# Patient Record
Sex: Female | Born: 1955 | Race: White | Hispanic: No | Marital: Married
Health system: Southern US, Community
[De-identification: ages and names within clinical notes are randomized; demographics above are authoritative.]

## PROBLEM LIST (undated history)

## (undated) DIAGNOSIS — C801 Malignant (primary) neoplasm, unspecified: Secondary | ICD-10-CM

## (undated) DIAGNOSIS — Z923 Personal history of irradiation: Secondary | ICD-10-CM

---
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
## 2005-05-17 ENCOUNTER — Ambulatory Visit: Payer: Self-pay | Admitting: Internal Medicine

## 2005-11-12 ENCOUNTER — Ambulatory Visit: Payer: Self-pay | Admitting: Internal Medicine

## 2006-06-12 ENCOUNTER — Ambulatory Visit: Payer: Self-pay | Admitting: Internal Medicine

## 2007-06-16 ENCOUNTER — Ambulatory Visit: Payer: Self-pay | Admitting: Internal Medicine

## 2007-06-19 ENCOUNTER — Ambulatory Visit: Payer: Self-pay | Admitting: Internal Medicine

## 2008-06-21 ENCOUNTER — Ambulatory Visit: Payer: Self-pay | Admitting: Internal Medicine

## 2009-07-14 ENCOUNTER — Ambulatory Visit: Payer: Self-pay | Admitting: Internal Medicine

## 2010-07-17 ENCOUNTER — Ambulatory Visit: Payer: Self-pay | Admitting: Internal Medicine

## 2011-07-23 ENCOUNTER — Ambulatory Visit: Payer: Self-pay | Admitting: Internal Medicine

## 2012-07-28 ENCOUNTER — Ambulatory Visit: Payer: Self-pay | Admitting: Internal Medicine

## 2013-04-22 ENCOUNTER — Ambulatory Visit: Payer: Self-pay | Admitting: Radiation Oncology

## 2013-04-23 ENCOUNTER — Ambulatory Visit: Payer: Self-pay | Admitting: Radiation Oncology

## 2013-05-12 LAB — CBC CANCER CENTER
Basophil #: 0 x10 3/mm (ref 0.0–0.1)
Eosinophil #: 0.1 x10 3/mm (ref 0.0–0.7)
HCT: 38.6 % (ref 35.0–47.0)
Lymphocyte #: 1.6 x10 3/mm (ref 1.0–3.6)
Lymphocyte %: 17.2 %
MCHC: 34.7 g/dL (ref 32.0–36.0)
MCV: 90 fL (ref 80–100)
Monocyte #: 0.5 x10 3/mm (ref 0.2–0.9)
Neutrophil #: 7.2 x10 3/mm — ABNORMAL HIGH (ref 1.4–6.5)
Neutrophil %: 75.7 %
Platelet: 233 x10 3/mm (ref 150–440)
RDW: 13.3 % (ref 11.5–14.5)
WBC: 9.5 x10 3/mm (ref 3.6–11.0)

## 2013-05-19 LAB — CBC CANCER CENTER
Basophil #: 0.1 x10 3/mm (ref 0.0–0.1)
Eosinophil #: 0.1 x10 3/mm (ref 0.0–0.7)
Eosinophil %: 1.6 %
Lymphocyte %: 28.7 %
MCH: 31.6 pg (ref 26.0–34.0)
MCV: 90 fL (ref 80–100)
Monocyte %: 5.9 %
Neutrophil #: 4.4 x10 3/mm (ref 1.4–6.5)
Platelet: 227 x10 3/mm (ref 150–440)

## 2013-05-24 ENCOUNTER — Ambulatory Visit: Payer: Self-pay | Admitting: Radiation Oncology

## 2013-06-23 ENCOUNTER — Ambulatory Visit: Payer: Self-pay | Admitting: Radiation Oncology

## 2013-07-30 ENCOUNTER — Ambulatory Visit: Payer: Self-pay | Admitting: Internal Medicine

## 2013-09-03 ENCOUNTER — Ambulatory Visit: Payer: Self-pay | Admitting: Gastroenterology

## 2013-11-26 ENCOUNTER — Ambulatory Visit: Payer: Self-pay | Admitting: Radiation Oncology

## 2013-12-22 ENCOUNTER — Ambulatory Visit: Payer: Self-pay | Admitting: Radiation Oncology

## 2014-06-08 ENCOUNTER — Ambulatory Visit: Payer: Self-pay | Admitting: Radiation Oncology

## 2014-06-23 ENCOUNTER — Ambulatory Visit: Payer: Self-pay | Admitting: Radiation Oncology

## 2014-08-02 ENCOUNTER — Ambulatory Visit: Payer: Self-pay | Admitting: Internal Medicine

## 2014-08-08 ENCOUNTER — Ambulatory Visit: Payer: Self-pay | Admitting: Internal Medicine
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
## 2015-06-09 ENCOUNTER — Ambulatory Visit: Payer: Self-pay | Admitting: Radiation Oncology

## 2015-06-15 ENCOUNTER — Other Ambulatory Visit: Payer: Self-pay | Admitting: Internal Medicine

## 2015-06-15 DIAGNOSIS — Z1231 Encounter for screening mammogram for malignant neoplasm of breast: Secondary | ICD-10-CM

## 2015-08-11 ENCOUNTER — Ambulatory Visit
Admission: RE | Admit: 2015-08-11 | Discharge: 2015-08-11 | Disposition: A | Discharge status: Home / Self Care | Payer: Federal, State, Local not specified - PPO | Source: Ambulatory Visit | Attending: Internal Medicine | Admitting: Internal Medicine

## 2015-08-11 DIAGNOSIS — Z1231 Encounter for screening mammogram for malignant neoplasm of breast: Secondary | ICD-10-CM

## 2015-08-11 HISTORY — DX: Malignant (primary) neoplasm, unspecified: C80.1

## 2016-06-18 ENCOUNTER — Other Ambulatory Visit: Payer: Self-pay | Admitting: Internal Medicine

## 2016-06-18 DIAGNOSIS — Z1231 Encounter for screening mammogram for malignant neoplasm of breast: Secondary | ICD-10-CM

## 2016-08-13 ENCOUNTER — Ambulatory Visit
Admission: RE | Admit: 2016-08-13 | Discharge: 2016-08-13 | Disposition: A | Discharge status: Home / Self Care | Payer: Federal, State, Local not specified - PPO | Source: Ambulatory Visit | Attending: Internal Medicine | Admitting: Internal Medicine

## 2016-08-13 DIAGNOSIS — Z1231 Encounter for screening mammogram for malignant neoplasm of breast: Secondary | ICD-10-CM | POA: Insufficient documentation

## 2016-08-14 ENCOUNTER — Other Ambulatory Visit: Payer: Self-pay | Admitting: Internal Medicine

## 2016-08-14 DIAGNOSIS — N6489 Other specified disorders of breast: Secondary | ICD-10-CM

## 2016-08-27 ENCOUNTER — Ambulatory Visit
Admission: RE | Admit: 2016-08-27 | Discharge: 2016-08-27 | Disposition: A | Discharge status: Home / Self Care | Payer: Federal, State, Local not specified - PPO | Source: Ambulatory Visit | Attending: Internal Medicine | Admitting: Internal Medicine

## 2016-08-27 DIAGNOSIS — N6489 Other specified disorders of breast: Secondary | ICD-10-CM | POA: Diagnosis present

## 2016-09-03 ENCOUNTER — Other Ambulatory Visit: Payer: Federal, State, Local not specified - PPO

## 2016-09-03 ENCOUNTER — Ambulatory Visit: Payer: Federal, State, Local not specified - PPO

## 2017-06-24 ENCOUNTER — Other Ambulatory Visit: Payer: Self-pay | Admitting: Internal Medicine

## 2017-06-24 DIAGNOSIS — Z1231 Encounter for screening mammogram for malignant neoplasm of breast: Secondary | ICD-10-CM

## 2017-08-18 ENCOUNTER — Ambulatory Visit
Admission: RE | Admit: 2017-08-18 | Discharge: 2017-08-18 | Disposition: A | Discharge status: Home / Self Care | Payer: Federal, State, Local not specified - PPO | Source: Ambulatory Visit | Attending: Internal Medicine | Admitting: Internal Medicine

## 2017-08-18 DIAGNOSIS — Z1231 Encounter for screening mammogram for malignant neoplasm of breast: Secondary | ICD-10-CM | POA: Insufficient documentation

## 2017-08-20 ENCOUNTER — Other Ambulatory Visit: Payer: Self-pay | Admitting: Internal Medicine

## 2017-08-20 DIAGNOSIS — R921 Mammographic calcification found on diagnostic imaging of breast: Secondary | ICD-10-CM

## 2017-08-20 DIAGNOSIS — N6489 Other specified disorders of breast: Secondary | ICD-10-CM

## 2017-08-20 DIAGNOSIS — R928 Other abnormal and inconclusive findings on diagnostic imaging of breast: Secondary | ICD-10-CM

## 2017-08-27 ENCOUNTER — Ambulatory Visit
Admission: RE | Admit: 2017-08-27 | Discharge: 2017-08-27 | Disposition: A | Discharge status: Home / Self Care | Payer: Federal, State, Local not specified - PPO | Source: Ambulatory Visit | Attending: Internal Medicine | Admitting: Internal Medicine

## 2017-08-27 DIAGNOSIS — R928 Other abnormal and inconclusive findings on diagnostic imaging of breast: Secondary | ICD-10-CM

## 2017-08-27 DIAGNOSIS — R921 Mammographic calcification found on diagnostic imaging of breast: Secondary | ICD-10-CM

## 2017-08-27 DIAGNOSIS — N6321 Unspecified lump in the left breast, upper outer quadrant: Secondary | ICD-10-CM | POA: Insufficient documentation

## 2017-08-27 DIAGNOSIS — N6489 Other specified disorders of breast: Secondary | ICD-10-CM | POA: Insufficient documentation

## 2017-08-27 DIAGNOSIS — N6001 Solitary cyst of right breast: Secondary | ICD-10-CM | POA: Diagnosis not present

## 2017-08-28 ENCOUNTER — Ambulatory Visit: Payer: Federal, State, Local not specified - PPO

## 2017-08-28 ENCOUNTER — Other Ambulatory Visit: Payer: Federal, State, Local not specified - PPO

## 2017-09-03 ENCOUNTER — Other Ambulatory Visit: Payer: Self-pay | Admitting: Internal Medicine

## 2017-09-03 DIAGNOSIS — N6310 Unspecified lump in the right breast, unspecified quadrant: Secondary | ICD-10-CM

## 2017-09-03 DIAGNOSIS — R928 Other abnormal and inconclusive findings on diagnostic imaging of breast: Secondary | ICD-10-CM

## 2017-09-08 ENCOUNTER — Ambulatory Visit
Admission: RE | Admit: 2017-09-08 | Discharge: 2017-09-08 | Disposition: A | Discharge status: Home / Self Care | Payer: Federal, State, Local not specified - PPO | Source: Ambulatory Visit | Attending: Internal Medicine | Admitting: Internal Medicine

## 2017-09-08 DIAGNOSIS — N6311 Unspecified lump in the right breast, upper outer quadrant: Secondary | ICD-10-CM | POA: Insufficient documentation

## 2017-09-08 DIAGNOSIS — R928 Other abnormal and inconclusive findings on diagnostic imaging of breast: Secondary | ICD-10-CM

## 2017-09-08 DIAGNOSIS — N6310 Unspecified lump in the right breast, unspecified quadrant: Secondary | ICD-10-CM

## 2017-09-08 HISTORY — PX: BREAST BIOPSY: SHX20

## 2017-09-09 LAB — SURGICAL PATHOLOGY

## 2017-11-06 ENCOUNTER — Other Ambulatory Visit: Payer: Self-pay | Admitting: Internal Medicine

## 2017-11-06 DIAGNOSIS — N6320 Unspecified lump in the left breast, unspecified quadrant: Principal | ICD-10-CM

## 2017-11-06 DIAGNOSIS — N6325 Unspecified lump in the left breast, overlapping quadrants: Secondary | ICD-10-CM

## 2017-11-13 ENCOUNTER — Ambulatory Visit
Admission: RE | Admit: 2017-11-13 | Discharge: 2017-11-13 | Disposition: A | Discharge status: Home / Self Care | Payer: Federal, State, Local not specified - PPO | Source: Ambulatory Visit | Attending: Internal Medicine | Admitting: Internal Medicine

## 2017-11-13 DIAGNOSIS — N6320 Unspecified lump in the left breast, unspecified quadrant: Secondary | ICD-10-CM | POA: Diagnosis present

## 2017-11-13 DIAGNOSIS — N6002 Solitary cyst of left breast: Secondary | ICD-10-CM | POA: Insufficient documentation

## 2017-11-13 DIAGNOSIS — N6325 Unspecified lump in the left breast, overlapping quadrants: Secondary | ICD-10-CM

## 2018-11-10 ENCOUNTER — Other Ambulatory Visit: Payer: Self-pay | Admitting: Internal Medicine

## 2018-11-10 DIAGNOSIS — R921 Mammographic calcification found on diagnostic imaging of breast: Secondary | ICD-10-CM

## 2018-11-10 DIAGNOSIS — N6001 Solitary cyst of right breast: Secondary | ICD-10-CM

## 2018-11-20 ENCOUNTER — Ambulatory Visit
Admission: RE | Admit: 2018-11-20 | Discharge: 2018-11-20 | Disposition: A | Discharge status: Home / Self Care | Payer: Federal, State, Local not specified - PPO | Source: Ambulatory Visit | Attending: Internal Medicine | Admitting: Internal Medicine

## 2018-11-20 DIAGNOSIS — R921 Mammographic calcification found on diagnostic imaging of breast: Secondary | ICD-10-CM | POA: Diagnosis present

## 2018-11-20 DIAGNOSIS — N6001 Solitary cyst of right breast: Secondary | ICD-10-CM | POA: Insufficient documentation

## 2019-02-02 ENCOUNTER — Ambulatory Visit: Admit: 2019-02-02 | Payer: Federal, State, Local not specified - PPO | Admitting: Internal Medicine

## 2019-02-02 SURGERY — COLONOSCOPY WITH PROPOFOL
Anesthesia: General

## 2019-03-05 IMAGING — MG MM DIGITAL SCREENING BILAT W/ TOMO W/ CAD
9 of 19 series · 9 of 35 positions shown · non-contrast
Comparison: Previous exam(s).

CLINICAL DATA: Screening.

EXAM:
2D DIGITAL SCREENING BILATERAL MAMMOGRAM WITH CAD AND ADJUNCT TOMO

[R MLO (1 of 2)]
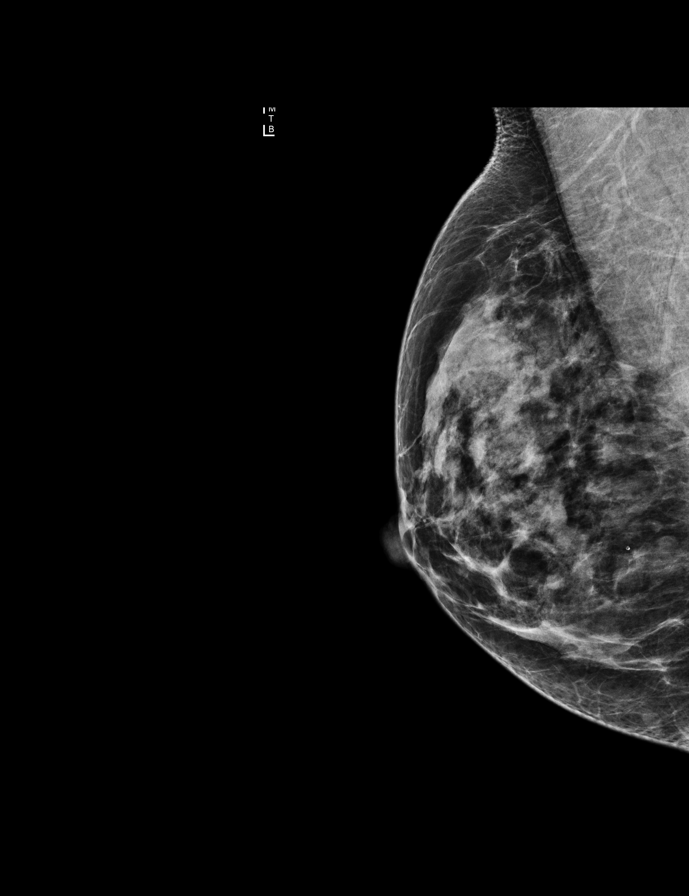

[L MLO synth-2D]
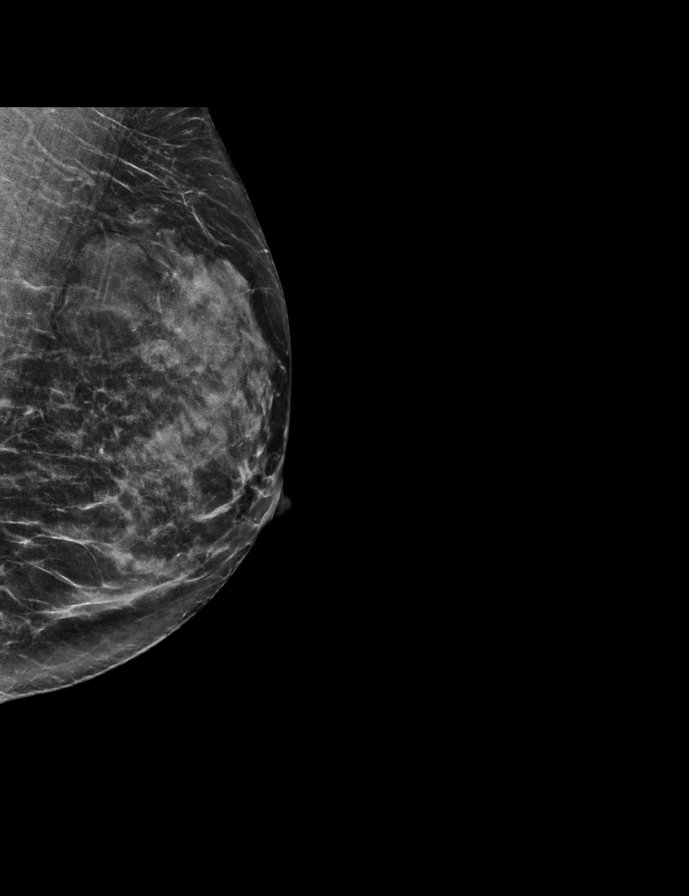

[L MLO]
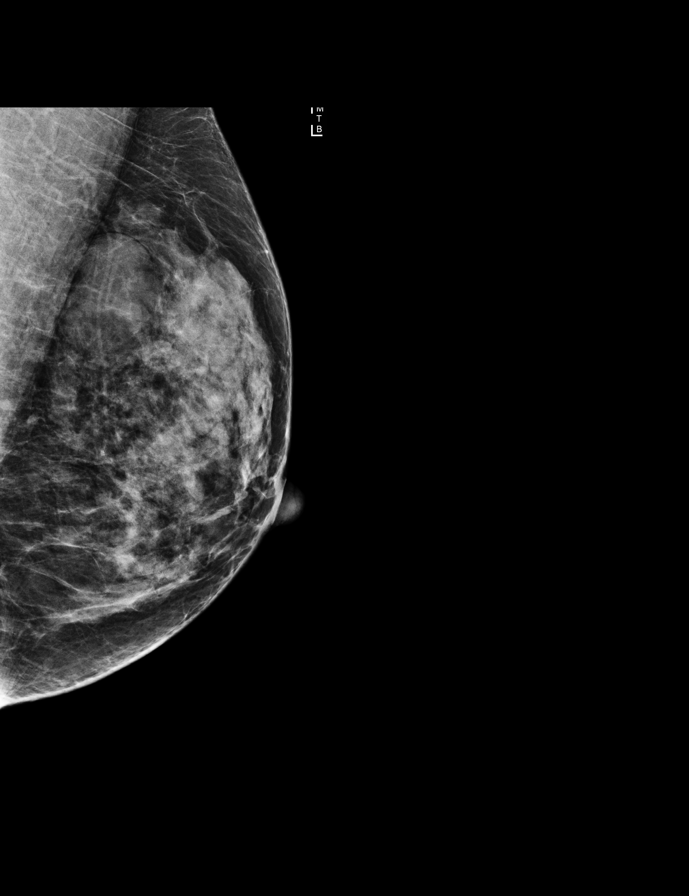

[R MLO (2 of 2)]
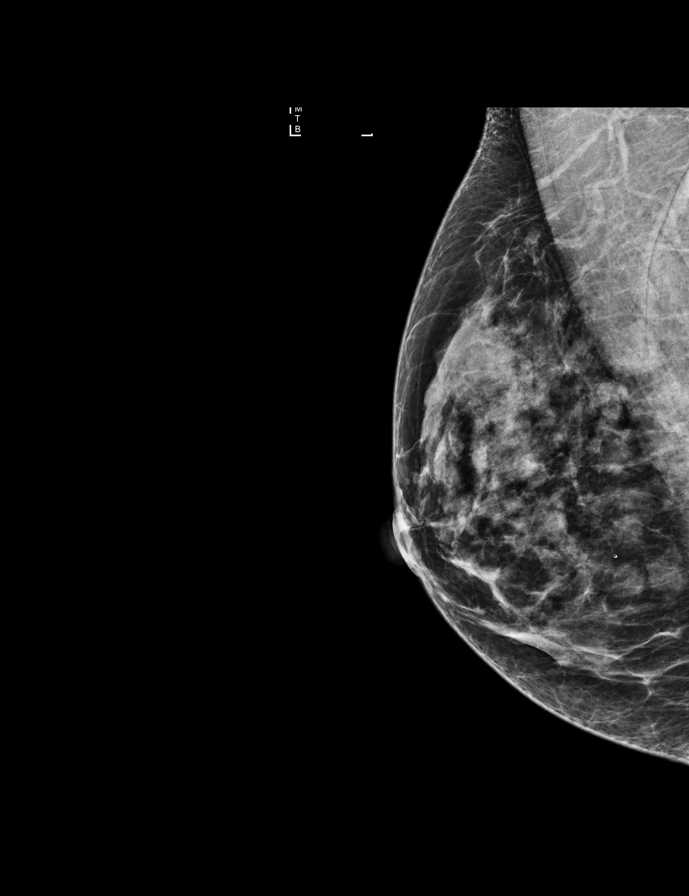

[R CC]
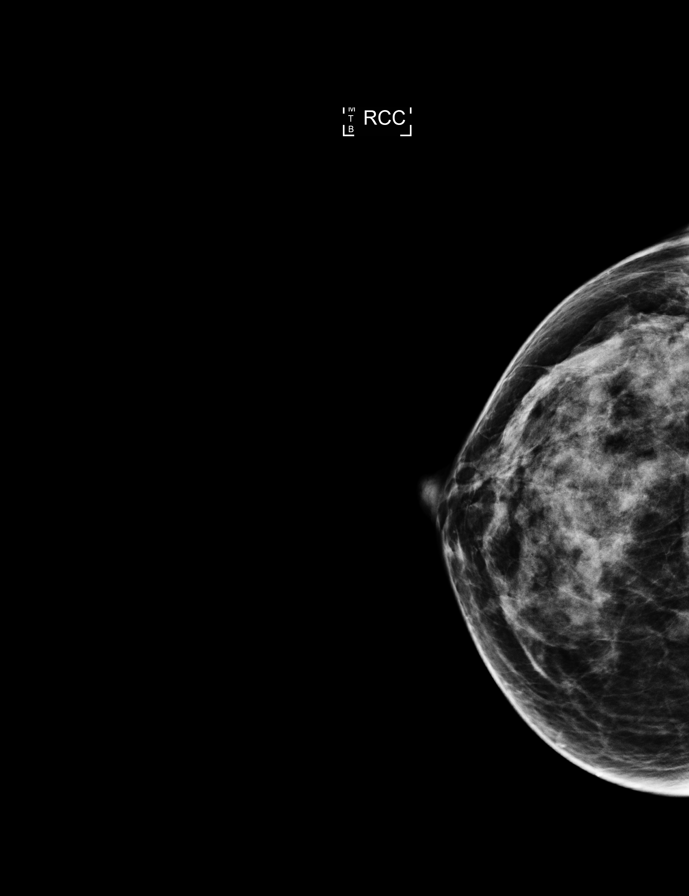

[R CC synth-2D]
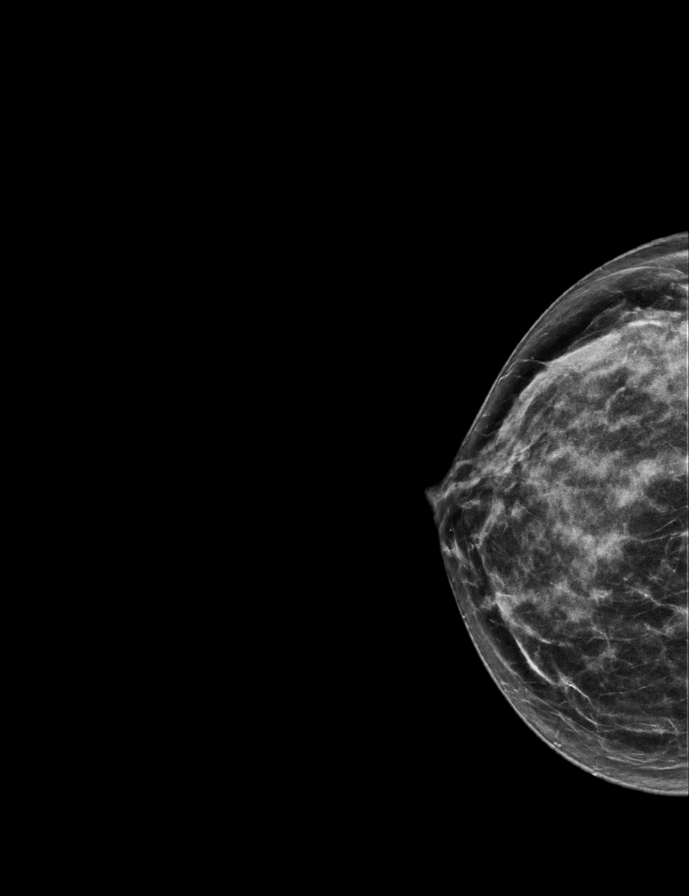

[L CC]
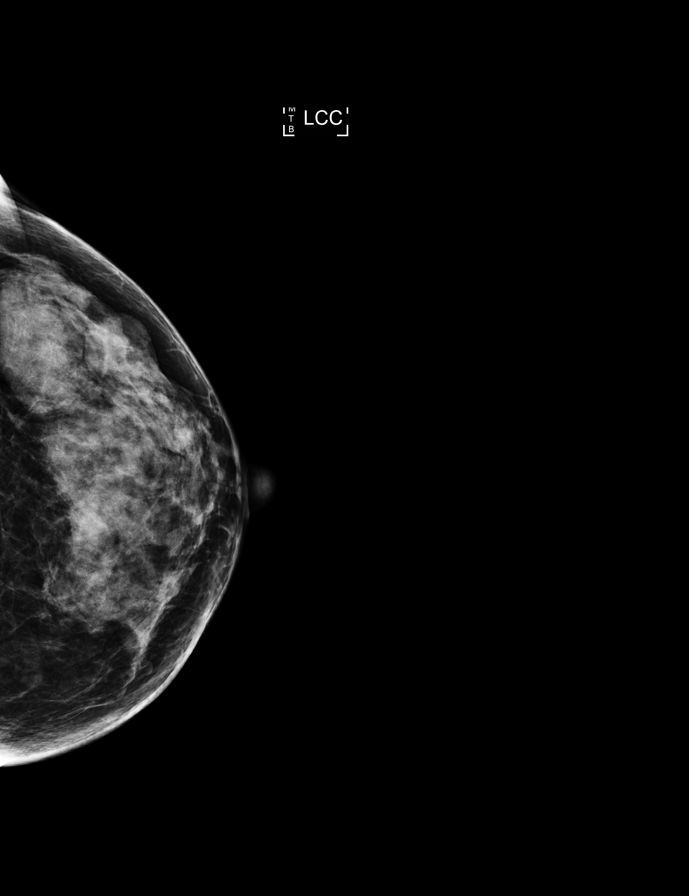

[R MLO synth-2D]
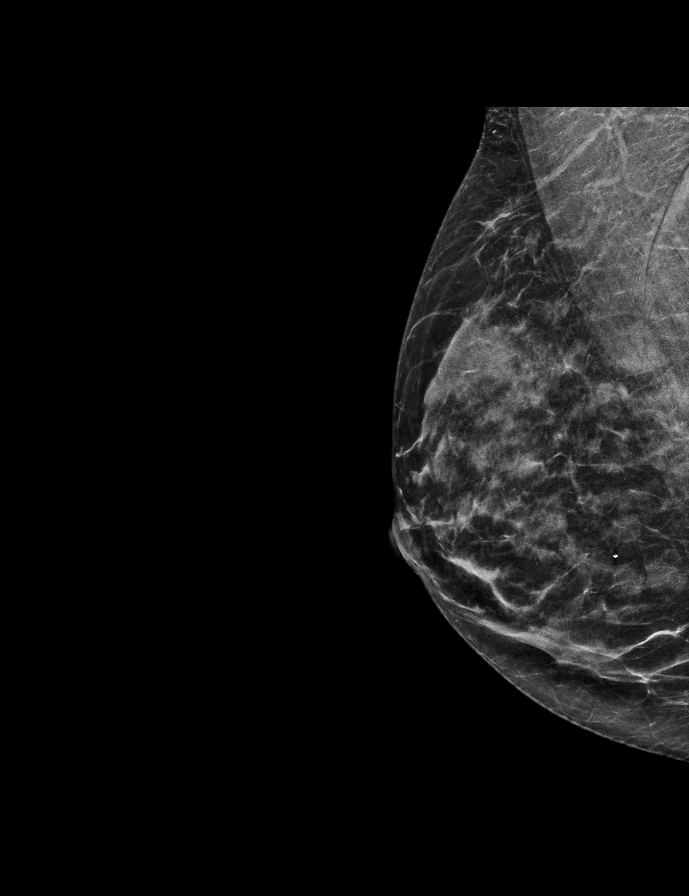

[L CC synth-2D]
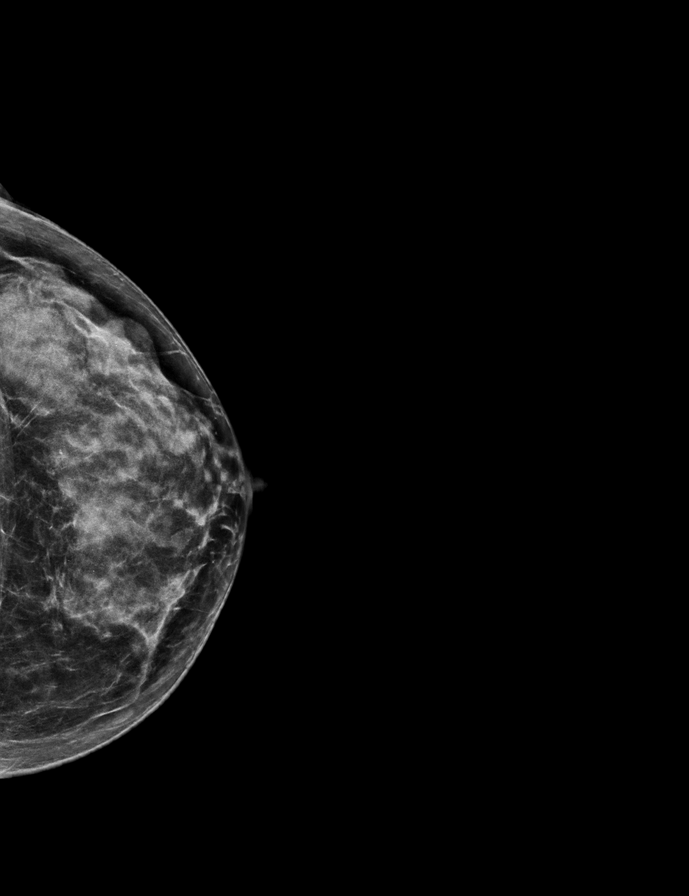

[9 of 35 positions shown; findings below may reference images not displayed]

ACR Breast Density Category c: The breast tissue is heterogeneously
dense, which may obscure small masses.
FINDINGS: In the right breast asymmetries and a separate group of
calcifications require further evaluation.

In the left breast asymmetry requires further evaluation.

Images were processed with CAD.
IMPRESSION: Further evaluation is suggested for possible asymmetries and
calcifications in the right breast.

Further evaluation is suggested for possible asymmetry in the left
breast.

RECOMMENDATION:
Diagnostic mammogram and possibly ultrasound of both breasts.
(Code:[id number])

The patient will be contacted regarding the findings, and additional
imaging will be scheduled.

BI-RADS CATEGORY  0: Incomplete. Need additional imaging evaluation
and/or prior mammograms for comparison.

## 2019-09-08 ENCOUNTER — Ambulatory Visit: Payer: Federal, State, Local not specified - PPO | Attending: Internal Medicine

## 2019-09-08 ENCOUNTER — Other Ambulatory Visit: Payer: Self-pay

## 2019-09-08 DIAGNOSIS — G8222 Paraplegia, incomplete: Secondary | ICD-10-CM | POA: Insufficient documentation

## 2019-09-08 DIAGNOSIS — M6281 Muscle weakness (generalized): Secondary | ICD-10-CM | POA: Diagnosis not present

## 2019-09-08 NOTE — Therapy (Signed)
[place] MAIN [facility] SERVICES [place], [place], [address] Phone: [phone]   Fax:  [phone]  Physical Therapy Evaluation  Patient Details  Name: Peggy Anderson MRN: [id number] Date of Birth: Feb 18, 1956 No data recorded  Encounter Date: 09/08/2019    Past Medical History:  Diagnosis Date  . 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 BREAST BIOPSY Right 09/08/2017   neg    There were no vitals filed for this visit.            PATIENT INFORMATION: This Evaluation form will serve as the LMN for the following suppliers:  Supplier: [employer] Contact Person: Deberah Pelton  JSHFW:[phone]   Reason for Referral: Patient/caregiver Goals: Patient was seen for face-to-face evaluation for new manual wheelchair.  Also present was   Deberah Pelton from [facility] to discuss recommendations and wheelchair options.  Further paperwork was completed and sent to vendor.  Patient appears to qualify for manual mobility device at this time per objective findings.    MEDICAL HISTORY: Diagnosis: paraplegia  Primary Diagnosis Onset: 1977 [] Progressive Disease Relevant Past and Future Surgeries:Herrington Rods in back in 1977 Height: 5 ft 6 per husband Weight: 119 lb Explain and recent changes or trends in weight: Patient reports no changes in weight in recent months.   Relevant History including falls:  No falls  Patient is a pleasant 63 year old female with paraplegia since 89. PMH includes cutaneous follicular lymphoma with radiation (2014), and osteoporosis. 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  Uses a manual chair at baseline since injury in 1977 and currently is working at the [employer] in [place]. She is independent with mobility in  manual chair and tranfers into/out of van without assistance, pulling her manual chair into van.    HOME ENVIRONMENT: [x] House  [] Condo/town home  [] Apartment  [] Assisted Living    [] Lives Alone [x]  Lives with Others: husband and adult daughter                                                 Hours with caregiver:   [x] Home is accessible to patient            Stairs  [] Yes [x]  No     Ramp [x] Yes [] No Comments:  Mostly carpet, hallways and kitchen hardwood, widened doorways. Ramp inside garage   COMMUNITY ADL: TRANSPORTATION: [] Car    [x] Lucianne Lei    [] Diplomatic Services operational officer    [] Adapted w/c Lift   [] Ambulance   [] Other:       [] Sits in wheelchair during transport  Employment/School:     Specific requirements pertaining to mobility                                                     Other: [place] chair and bring into car with use of assistive equipment independently.  FUNCTIONAL/SENSORY PROCESSING SKILLS:  Handedness:   [x] Right     [] Left    [] NA  Comments:                                 Functional Processing Skills for Wheeled Mobility [x] Processing Skills are adequate for safe wheelchair operation  Areas of concern than may interfere with safe operation of wheelchair Description of problem   []  Attention to environment     [] Judgment     []  Hearing  []  Vision or visual processing    [] Motor Planning  []  Fluctuations in Behavior                                                   VERBAL COMMUNICATION: [x] WFL receptive [x]  WFL expressive [] Understandable  [] Difficult to understand  [] non-communicative []  Uses an augmented communication device    CURRENT SEATING / MOBILITY: Current Mobility Base:   [] None  [] Dependent  [x] Manual  [] Scooter  [] Power   Type of Control:                       Manufacturer:  Sunrise Quicky 2            Size:    standard adult          Age:   13 years                        Current Condition of Mobility  Base:    Worn, replaced breaks, very old and broken down in regards to seat, breaks, handles, back rest.                                                                                                           Current Wheelchair components:    Foldable, lightweight                                                                                                                               Describe posture in present seating system:      Functional; slight forward head rounded shoulders with kyphotic trunk due to upright posture.  SENSATION and SKIN ISSUES: Sensation [] Intact [x] Impaired [] Absent   Level of sensation:   Limited hot cold R,                        Pressure Relief: Able to perform effective pressure relief :   [x] Yes  []  No Method:      Arm press up/tricep press, lateral leans                                                                        If not, Why?:                                                                          Skin Issues/Skin Integrity Current Skin Issues   [] Yes [] No  [] Intact []  Red area []  Open Area  [] Scar Tissue [] At risk from prolonged sitting  Where                              History of Skin Issues   [x] Yes [] No  Where   buttocks                                      When: one time from visiting [place]  17 years ago                                             Hx of skin flap surgeries [] Yes [x] No  Where                  When                                                  Limited sitting tolerance [] Yes [x] No Hours spent sitting in wheelchair daily:                                                         Complaint of Pain:  Please describe:     Has pain sometimes in L low back/hip starting this year.     Shoots down L leg to back of knee.  Swelling/Edema:       Bilateral ankles ; wears compression socks                                                                                                                                             ADL STATUS (in reference to wheelchair use):  Indep Assist Unable Indep with Equip Not assessed Comments  Dressing                                 x                        Able to dress self sitting in wheelchair or stool                Eating     x                                                                                                                         Toileting                                          x                                                Continent bladder but requires bowel program for bowel movements                                   Bathing                                       x                             Slide transfer to shower chair  Grooming/ Hygiene       x                                                                                                                       Meal Prep       x                                                                                                                   IADLS                                      x                             Sits in manual w/c for all                                                Bowel Management: [] Continent  [x] Incontinent  [] Accidents Comments:  Needs help with bowel movements.                                                 Bladder Management: [x] Continent  [] Incontinent  [] Accidents Comments:                                              WHEELCHAIR SKILLS: Manual w/c Propulsion: [x] UE or LE strength and endurance sufficient to participate in ADLs using manual wheelchair Arm :  [] left [] right  [x] Both                                   Foot:   [] left [] right  [] Both  Distance: not limited, uses current manual chair for all mobility and  distances without deficits.   Operate Scooter: []  Strength, hand grip, balance and transfer appropriate for use [] Living environment is accessible for use of scooter  Operate Power w/c:  []  Std. Joystick   []   Alternative Controls Indep []  Assist []  Dependent/ Unable []  N/A [x]  [] Safe          []  Functional      Distance:                Bed confined without wheelchair [x]  Yes []  No   STRENGTH/RANGE OF MOTION:  Range of Motion Strength  Shoulder         WFL                                                   5/5 bilaterally                                                  Elbow         WFL                                                                                                     5/5 bilaterally   Wrist/Hand                WFL                                                              5/5 bilaterally                                                           Hip               Passively : WFL, limited active                                                              L: Hip flex 1/5, abduct no activation, adduction 2/5, hip extension no activation noted                 R: hip flex 2/5 abduct: no activation,  Adduct 2/5 ; hip extension: no activation noted                              Knee    Passively WFL; active limited by strength in LLE  L 3/5 knee extension, 3-/5 knee flexion              R 3+/5 knee extension  3/5 knee flexion                                                      Ankle  L foot inversion 8 degrees. Able to obtain neutral with passive assistance R WFL                                                                  L 1/5           R 2/5       MOBILITY/BALANCE:  []  Patient is totally dependent for mobility                                                                                               Balance Transfers Ambulation  Sitting Balance: Standing Balance: [x]  Independent []   Independent/Modified Independent  [x]  WFL     []  WFL []  Supervision []  Supervision  []  Uses UE for balance  []  Supervision []  Min Assist []  Ambulates with Assist                           []  Min Assist [x]  Min assist with UE support for short durations []  Mod Assist []  Ambulates with Device:  []  RW   []  StW   []  Cane   []                 []  Mod Assist []  Mod assist []  Max assist   []  Max Assist []  Max assist []  Dependent []  Indep. Short Distance Only  []  Unable []  Unable []  Lift / Sling Required Distance (in feet)                             []  Sliding board [x]  Unable to Ambulate: (Explain: partial paraplegic, unable to ambulate)  Cardio Status:  [x] Intact  []  Impaired   []  NA                              Respiratory Status:  [x] Intact   [] Impaired   [] NA                                     Orthotics/Prosthetics:     n/a  Comments (Address manual vs power w/c vs scooter):     Patient is a pleasant 63 year old female who has utilized manual wheelchairs for mobility for more than 20 years and is nonambulatory. She has functional strength and capacity for prolonged propulsion of manual chair and would benefit from the [facility] 2 manual chair.  A lightweight chair is required due to patient transferring self and chair into/out of car, use of chair at work and at home, and time spent in chair.  Patient does not require a power chair or scooter at this time due to functional mobility with manual chair. Patient is able to pressure relief and transfer without assistance and has strong upper body for propulsion.  A stimulite slimline supracor cushion will benefit the patient to promote good pelvic positioning as well as reduce risk for skin breakdown due to history and limited sensation. A tension adjustable back rest will allow for correction of patient's trunk position at home and at work for improved propulsion of wheelchair,  reduction of pain, and reduced risk of injury from improper biomechanics.   Semi pneumatic 6x 1.5 inch castors will benefit patient due to her mobility and retain her QOL.  In conclusion a Quickie 2 manual chair is the best choice for this patient at this time.                                                      Anterior / Posterior Obliquity Rotation-Pelvis  PELVIS    [x] Neutral  []  Posterior  []  Anterior     [x] WFL  [] Right Elevated  [] Left Elevated   [x] WFL  [] Right Anterior []   Left Anterior    []  Fixed [x]  Partly Flexible []  Flexible  []  Other  []  Fixed  [x]  Partly Flexible  []  Flexible []  Other  []  Fixed  [x]  Partly Flexible  []  Flexible []  Other  TRUNK [x] WFL [] Thoracic Kyphosis [] Lumbar Lordosis   [x]  WFL [] Convex Right [] Convex Left   [] c-curve [] s-curve [] multiple  [x]  Neutral []  Left-anterior []  Right-anterior    []  Fixed []  Flexible [x]  Partly Flexible       Other  []  Fixed []  Flexible []  Partly Flexible []  Other  []  Fixed           []  Flexible [x]  Partly Flexible []  Other   Position Windswept   HIPS  [x]  Neutral []  Abduct []  ADduct [x]  Neutral []  Right []  Left       []  Fixed  [x]  Partly Flexible             []  Dislocated []  Flexible []  Subluxed    []  Fixed [x]  Partly Flexible  []  Flexible []  Other              Foot Positioning Knee Positioning   Knees and  Feet  [x]  WFL [] Left [x] Right [x]  WFL [] Left [] Right   KNEES ROM concerns: ROM concerns:   & Dorsi-Flexed                    [] Lt [] Rt                                  FEET Plantar Flexed                  []   Lt [] Rt     Inversion                    [x] Lt [] Rt     Eversion                    [] Lt [] Rt    HEAD [x]  Functional [x]  Good Head Control   & []  Flexed         []  Extended []  Adequate Head Control   NECK []  Rotated  Lt  []  Lat Flexed Lt []  Rotated  Rt []  Lat Flexed Rt []  Limited Head Control    []  Cervical Hyperextension []  Absent  Head  Control    SHOULDERS ELBOWS WRIST& HAND         Left     Right    Left     Right  U/E [x] Functional  Left            [x] Functional  Right                                 [] Fisting             [] Fisting     [] elevated Left [] depressed  Left [] elevated Right [] depressed  Right      [] protracted Left [] retracted Left [] protracted Right [] retracted Right [] subluxed  Left              [] subluxed  Right         Goals for Wheelchair Mobility  [x]  Independence with mobility in the home with motor related ADLs (MRADLs)  [x]  Independence with MRADLs in the community []  Provide dependent mobility  []  Provide recline     [] Provide tilt   Goals for Seating system [x]  Optimize pressure distribution [x]  Provide support needed to facilitate function or safety [x]  Provide corrective forces to assist with maintaining or improving posture []  Accommodate client's posture: current seated postures and positions are not flexible or will not tolerate corrective forces [x]  Client to be independent with relieving pressure in the wheelchair [] Enhance physiological function such as breathing, swallowing, digestion  Simulation ideas/Equipment trials:      Sunrise Quickie 2 manual chair                                                                                          State why other equipment was unsuccessful:   She has functional strength and capacity for prolonged propulsion of manual chair and would benefit from the [employer] 2 manual chair.  A lightweight chair is required due to patient transferring self and chair into/out of car, use of chair at work and at home, and time spent in chair.  Patient does not require a power chair or scooter at this time due to functional mobility with manual chair.  MOBILITY BASE RECOMMENDATIONS and JUSTIFICATION: MOBILITY COMPONENT JUSTIFICATION  Manufacturer:    Sunrise        Model: Quickie 2              Size: Width : 16 in          Seat Depth : 18  in          [x] provide transport from point A to B [x] promote Indep mobility  [x] is not a safe, functional ambulator [x] walker or cane inadequate [] non-standard width/depth necessary to accommodate anatomical measurement []                             [x] Manual Mobility Base [x] non-functional ambulator    [] Scooter/POV  [] can safely operate  [] can safely transfer   [] has adequate trunk stability  [] cannot functionally propel manual w/c  [] Power Mobility Base  [] non-ambulatory  [] cannot functionally propel manual wheelchair  []  cannot functionally and safely operate scooter/POV [] can safely operate and willing to  [] Stroller Base [] infant/child  [] unable to propel manual wheelchair [] allows for growth [] non-functional ambulator [] non-functional UE [] Indep mobility is not a goal at this time  [] Tilt  [] Forward                   [] Backward                  [] Powered tilt              [] Manual tilt  [] change position against gravitational force on head and shoulders  [] change position for pressure relief/cannot weight shift [] transfers  [] management of tone [] rest periods [] control edema [] facilitate postural control  []                                       [] Recline  [] Power recline on power base [] Manual recline on manual base  [] accommodate femur to back angle  [] bring to full recline for ADL care  [] change position for pressure relief/cannot weight shift [] rest periods [] repositioning for transfers or clothing/diaper /catheter changes [] head positioning  [x] Lighter weight required [x] self- propulsion  [x] lifting []                                                 [] Heavy Duty required [] user weight greater than 250# [] extreme tone/ over active movement [] broken frame on previous chair []                                     [x]  Back  []  Angle Adjustable []  Custom molded                            [] postural control [] control of tone/spasticity [] accommodation of range of motion [] UE functional control [] accommodation for seating system []                                          [] provide lateral trunk support [] accommodate deformity [] provide posterior trunk support [] provide lumbar/sacral support [] support trunk in midline [] Pressure relief over spinal processes  [  x] Seat Cushion                       [x] impaired sensation  [] decubitus ulcers present [x] history of pressure ulceration [] prevent pelvic extension [x] low maintenance  [] stabilize pelvis  [] accommodate obliquity [] accommodate multiple deformity [] neutralize lower extremity position [x] increase pressure distribution []                                           []  Pelvic/thigh support  []  Lateral thigh guide []  Distal medial pad  []  Distal lateral pad []  pelvis in neutral [] accommodate pelvis []  position upper legs []  alignment []  accommodate ROM []  decrease adduction [] accommodate tone [] removable for transfers [] decrease abduction  []  Lateral trunk Supports []  Lt     []  Rt [] decrease lateral trunk leaning [] control tone [] contour for increased contact [] safety  [] accommodate asymmetry []                                                 []  Mounting hardware  [] lateral trunk supports  [] back   [] seat [] headrest      []  thigh support [] fixed   [] swing away [] attach seat platform/cushion to w/c frame [] attach back cushion to w/c frame [] mount postural supports [] mount headrest  [] swing medial thigh support away [] swing lateral supports away for transfers  []                                                     Armrests  [] fixed [] adjustable height [] removable   [] swing away  [x] flip back   [] reclining [] full length pads [x] desk    [] pads tubular  [x] provide support with elbow at 90   [] provide support for w/c tray [] change of height/angles for variable activities [] remove for  transfers [x] allow to come closer to table top [] remove for access to tables []                                               Hangers/ Leg rests  [] 60 [x] 70 [] 90 [] elevating [] heavy duty  [] articulating [] fixed [] lift off [x] swing away     [] power [x] provide LE support  [] accommodate to hamstring tightness [] elevate legs during recline   [x] provide change in position for Legs [] Maintain placement of feet on footplate [] durability [] enable transfers [] decrease edema [] Accommodate lower leg length []                                         Foot support Footplate    [] Lt  []  Rt  []  Center mount [] flip up                            [] depth/angle adjustable [] Amputee adapter    []  Lt     []  Rt [] provide foot support [] accommodate to ankle ROM [] transfers [] Provide support for residual extremity []  allow  foot to go under wheelchair base []  decrease tone  []                                                 []  Ankle strap/heel loops [] support foot on foot support [] decrease extraneous movement [] provide input to heel  [] protect foot  Tires: [x] pneumatic  [] flat free inserts  [] solid  [x] decrease maintenance  [x] prevent frequent flats [] increase shock absorbency [] decrease pain from road shock [] decrease spasms from road shock []                                              []  Headrest  [] provide posterior head support [] provide posterior neck support [] provide lateral head support [] provide anterior head support [] support during tilt and recline [] improve feeding   [] improve respiration [] placement of switches [] safety  [] accommodate ROM  [] accommodate tone [] improve visual orientation  []  Anterior chest strap []  Vest []  Shoulder retractors  [] decrease forward movement of shoulder [] accommodation of TLSO [] decrease forward movement of trunk [] decrease shoulder elevation [] added abdominal support [] alignment [] assistance with shoulder control  []                                                Pelvic Positioner: patient declined belt [] Belt [] SubASIS bar [] Dual Pull [] stabilize tone [] decrease falling out of chair/ **will not Decrease potential for sliding due to pelvic tilting [] prevent excessive rotation [] pad for protection over boney prominence [] prominence comfort [] special pull angle to control rotation []                                                  Upper ExtremitySupport  [] L   []  R [] Arm trough   [] hand support []  tray       [] full tray [] swivel mount [] decrease edema      [] decrease subluxation   [] control tone   [] placement for AAC/Computer/EADL [] decrease gravitational pull on shoulders [] provide midline positioning [] provide support to increase UE function [] provide hand support in natural position [] provide work surface   POWER WHEELCHAIR CONTROLS  [] Proportional  [] Non-Proportional Type                                      [] Left  [] Right [] provides access for controlling wheelchair   [] lacks motor control to operate proportional drive control [] unable to understand proportional controls  Actuator Control Module  [] Single  [] Multiple   [] Allow the client to operate the power seat function(s) through the joystick control   [] Safety Reset Switches [] Used to change modes and stop the wheelchair when driving in latch mode    [] Upgraded Electronics   [] programming for accurate control [] progressive Disease/changing condition [] non-proportional drive control needed [] Needed in order to operate power seat functions through joystick control   [] Display box [] Allows user to see in which mode and drive the wheelchair is set  [] necessary for alternate controls    []   Digital interface electronics [] Allows w/c to operate when using alternative drive controls  [] ASL Head Array [] Allows client to operate wheelchair  through switches placed in tri-panel headrest  [] Sip and puff with tubing kit [] needed to operate sip and puff drive controls   [] Upgraded tracking electronics [] increase safety when driving [] correct tracking when on uneven surfaces  [] Mount for switches or joystick [] Attaches switches to w/c  [] Swing away for access or transfers [] midline for optimal placement [] provides for consistent access  [] Attendant controlled joystick plus mount [] safety [] long distance driving [] operation of seat functions [] compliance with transportation regulations []                                             Rear wheel placement/Axle adjustability [] None [] semi adjustable [x] fully adjustable  [x] improved UE access to wheels [x] improved stability [] changing angle in space for improvement of postural stability [] 1-arm drive access [] amputee pad placement []                                Wheel rims/ hand rims  [x] metal   [] plastic coated [] oblique projections           [] vertical projections [x] Provide ability to propel manual wheelchair  []  Increase self-propulsion with hand weakness/decreased grasp  Push handles [] extended   [] angle adjustable              [x] standard [x] caregiver access [x] caregiver assist [] allows "hooking" to enable increased ability to perform ADLs or maintain balance  One armed device   [] Lt   [] Rt [] enable propulsion of manual wheelchair with one arm   []                                            Brake/wheel lock extension [x]  Lt   [x]  Rt [x] increase indep in applying wheel locks   [] Side guards [] prevent clothing getting caught in wheel or becoming soiled []  prevent skin tears/abrasions  Battery:                                            [] to power wheelchair                                                         Other:    Cushion: stimulite slimline supracor Backrest: tension adjustable               Castors: 6 x1.5 semi pneumatic  The above equipment has a life- long use expectancy. Growth and  changes in medical and/or functional conditions would be the exceptions. This is to certify that the therapist has no financial relationship with durable medical provider or manufacturer. The therapist will not receive remuneration of any kind for the equipment recommended in this evaluation.   Patient has mobility limitation that significantly impairs safe, timely participation in one or more mobility related ADL's. (bathing, toileting, feeding, dressing, grooming, moving from room to room)  [x]  Yes []  No  Will mobility device sufficiently improve ability to participate and/or be aided in participation of MRADL's?      [x]  Yes []  No  Can limitation be compensated for with use of a cane or walker?                                    []  Yes [x]  No  Does patient or caregiver demonstrate ability/potential ability & willingness to safely use the mobility device?    [x]  Yes []  No  Does patient's home environment support use of recommended mobility device?            [x]  Yes []  No  Does patient have sufficient upper extremity function necessary to functionally propel a manual wheelchair?     [x]  Yes []  No  Does patient have sufficient strength and trunk stability to safely operate a POV (scooter)?                                  [x]  Yes []  No  Does patient need additional features/benefits provided by a power wheelchair for MRADL's in the home?        []  Yes [x]  No  Does the patient demonstrate the ability to safely use a power wheelchair?                   [x]  Yes []  No     Physician's Name Printed:                                                        69 Signature:  Date:     This is to certify that I, the above signed therapist have the following affiliations: []  This DME provider []  Manufacturer of recommended equipment []  Patient's long term care facility [x]  None of the above  Therapist Name/Signature: Janna Arch, PT, DPT                                             Date: 09/08/19       Objective measurements completed on examination: See above findings.                             Patient will benefit from skilled therapeutic intervention in order to improve the following deficits and impairments:     Visit Diagnosis: No diagnosis found.     Problem List There are no problems to display for this patient.  Janna Arch, PT, DPT   09/08/2019, 10:20  AM  [place] [facility] [address] [place], [place], [address] Phone: [phone]   Fax:  [phone]  Name: SOLIYANA MCCHRISTIAN MRN: [id number] Date of Birth: December 26, 1955

## 2019-11-15 ENCOUNTER — Other Ambulatory Visit: Payer: Self-pay | Admitting: Internal Medicine

## 2019-11-15 DIAGNOSIS — R921 Mammographic calcification found on diagnostic imaging of breast: Secondary | ICD-10-CM

## 2019-11-30 ENCOUNTER — Ambulatory Visit
Admission: RE | Admit: 2019-11-30 | Discharge: 2019-11-30 | Disposition: A | Discharge status: Home / Self Care | Payer: Federal, State, Local not specified - PPO | Source: Ambulatory Visit | Attending: Internal Medicine | Admitting: Internal Medicine

## 2019-11-30 DIAGNOSIS — R921 Mammographic calcification found on diagnostic imaging of breast: Secondary | ICD-10-CM

## 2019-11-30 HISTORY — DX: Personal history of irradiation: Z92.3

## 2020-10-24 ENCOUNTER — Other Ambulatory Visit: Payer: Self-pay | Admitting: Internal Medicine

## 2020-10-24 DIAGNOSIS — Z1231 Encounter for screening mammogram for malignant neoplasm of breast: Secondary | ICD-10-CM

## 2020-11-30 ENCOUNTER — Other Ambulatory Visit: Payer: Self-pay

## 2020-11-30 ENCOUNTER — Ambulatory Visit
Admission: RE | Admit: 2020-11-30 | Discharge: 2020-11-30 | Disposition: A | Discharge status: Home / Self Care | Payer: Federal, State, Local not specified - PPO | Source: Ambulatory Visit | Attending: Internal Medicine | Admitting: Internal Medicine

## 2020-11-30 DIAGNOSIS — Z1231 Encounter for screening mammogram for malignant neoplasm of breast: Secondary | ICD-10-CM | POA: Insufficient documentation

## 2021-10-22 ENCOUNTER — Other Ambulatory Visit: Payer: Self-pay | Admitting: Internal Medicine

## 2021-10-22 DIAGNOSIS — Z1231 Encounter for screening mammogram for malignant neoplasm of breast: Secondary | ICD-10-CM

## 2021-12-04 ENCOUNTER — Other Ambulatory Visit: Payer: Self-pay

## 2021-12-04 ENCOUNTER — Ambulatory Visit
Admission: RE | Admit: 2021-12-04 | Discharge: 2021-12-04 | Disposition: A | Discharge status: Home / Self Care | Payer: Federal, State, Local not specified - PPO | Source: Ambulatory Visit | Attending: Internal Medicine | Admitting: Internal Medicine

## 2021-12-04 DIAGNOSIS — Z1231 Encounter for screening mammogram for malignant neoplasm of breast: Secondary | ICD-10-CM | POA: Diagnosis present

## 2022-10-14 ENCOUNTER — Other Ambulatory Visit: Payer: Self-pay | Admitting: Internal Medicine

## 2022-10-14 DIAGNOSIS — Z1231 Encounter for screening mammogram for malignant neoplasm of breast: Secondary | ICD-10-CM

## 2022-11-13 ENCOUNTER — Encounter: Payer: Self-pay | Admitting: Internal Medicine

## 2022-11-13 ENCOUNTER — Encounter
Admission: RE | Disposition: A | Discharge status: Home / Self Care | Payer: Self-pay | Source: Home / Self Care | Attending: Internal Medicine

## 2022-11-13 ENCOUNTER — Ambulatory Visit: Payer: Medicare Other | Admitting: Registered Nurse

## 2022-11-13 ENCOUNTER — Ambulatory Visit
Admission: RE | Admit: 2022-11-13 | Discharge: 2022-11-13 | Disposition: A | Discharge status: Home / Self Care | Payer: Medicare Other | Attending: Internal Medicine | Admitting: Internal Medicine

## 2022-11-13 DIAGNOSIS — Z1211 Encounter for screening for malignant neoplasm of colon: Secondary | ICD-10-CM | POA: Insufficient documentation

## 2022-11-13 DIAGNOSIS — Z83719 Family history of colon polyps, unspecified: Secondary | ICD-10-CM | POA: Insufficient documentation

## 2022-11-13 HISTORY — PX: COLONOSCOPY: SHX5424

## 2022-11-13 SURGERY — COLONOSCOPY
Anesthesia: General

## 2022-11-13 MED ORDER — PROPOFOL 1000 MG/100ML IV EMUL
INTRAVENOUS | Status: AC
  Filled 2022-11-13: qty 100

## 2022-11-13 MED ORDER — PROPOFOL 500 MG/50ML IV EMUL
INTRAVENOUS | Status: DC | PRN
Start: 2022-11-13 — End: 2022-11-13
  Administered 2022-11-13: 125 ug/kg/min via INTRAVENOUS

## 2022-11-13 MED ORDER — PROPOFOL 10 MG/ML IV BOLUS
INTRAVENOUS | Status: DC | PRN
Start: 2022-11-13 — End: 2022-11-13
  Administered 2022-11-13: 70 mg via INTRAVENOUS

## 2022-11-13 MED ORDER — LIDOCAINE HCL (CARDIAC) PF 100 MG/5ML IV SOSY
PREFILLED_SYRINGE | INTRAVENOUS | Status: DC | PRN
  Administered 2022-11-13: 20 mg via INTRAVENOUS

## 2022-11-13 MED ORDER — STERILE WATER FOR IRRIGATION IR SOLN
Status: DC | PRN
  Administered 2022-11-13: 60 mL

## 2022-11-13 MED ORDER — LIDOCAINE HCL (PF) 2 % IJ SOLN
INTRAMUSCULAR | Status: AC
  Filled 2022-11-13: qty 5

## 2022-11-13 MED ORDER — SODIUM CHLORIDE 0.9 % IV SOLN
INTRAVENOUS | Status: DC
  Administered 2022-11-13: 20 mL/h via INTRAVENOUS

## 2022-11-13 NOTE — Op Note (Signed)
[facility] Gastroenterology Patient Name: Peggy Anderson Procedure Date: 11/13/2022 6:55 AM MRN: [id number] Account #: [id number] Date of Birth: [birth year] Admit Type: Outpatient Age: 67 Room: [facility] ENDO ROOM 2 Gender: Female Note Status: Finalized Instrument Name: Jasper Riling [id number] Procedure:             Colonoscopy Indications:           Colon cancer screening in patient at increased risk:                         Family history of 1st-degree relative with colon                         polyps at age 29 years (or older) Providers:             Herberto Ledwell K. [name] MD, MD Referring MD:          Rusty Aus, MD (Referring MD) Medicines:             Propofol per Anesthesia Complications:         No immediate complications. Procedure:             Pre-Anesthesia Assessment:                        - The risks and benefits of the procedure and the                         sedation options and risks were discussed with the                         patient. All questions were answered and informed                         consent was obtained.                        - Patient identification and proposed procedure were                         verified prior to the procedure by the nurse. The                         procedure was verified in the procedure room.                        - ASA Grade Assessment: III - A patient with severe                         systemic disease.                        - After reviewing the risks and benefits, the patient                         was deemed in satisfactory condition to undergo the                         procedure.  After obtaining informed consent, the colonoscope was                         passed under direct vision. Throughout the procedure,                         the patient's blood pressure, pulse, and oxygen                         saturations were monitored continuously. The                          Colonoscope was introduced through the anus and                         advanced to the the cecum, identified by appendiceal                         orifice and ileocecal valve. The colonoscopy was                         technically difficult and complex due to restricted                         mobility of the colon, a redundant colon and a                         tortuous colon. Successful completion of the procedure                         was aided by straightening and shortening the scope to                         obtain bowel loop reduction and using scope torsion.                         The patient tolerated the procedure well. The quality                         of the bowel preparation was good. The ileocecal                         valve, appendiceal orifice, and rectum were                         photographed. Findings:      The perianal and digital rectal examinations were normal. Pertinent       negatives include normal sphincter tone and no palpable rectal lesions.      The entire examined colon appeared normal on direct and retroflexion       views. Impression:            - The entire examined colon is normal on direct and                         retroflexion views.                        - No specimens collected.  Recommendation:        - Patient has a contact number available for                         emergencies. The signs and symptoms of potential                         delayed complications were discussed with the patient.                         Return to normal activities tomorrow. Written                         discharge instructions were provided to the patient.                        - Resume previous diet.                        - Continue present medications.                        - Repeat colonoscopy in 5 years for screening purposes.                        - Return to GI office PRN.                        - The findings and recommendations were  discussed with                         the patient. Procedure Code(s):     --- Professional ---                        [id number], Colorectal cancer screening; colonoscopy on                         individual at high risk Diagnosis Code(s):     --- Professional ---                        Z83.71, Family history of colonic polyps CPT copyright 2022 American [employer] Association. All rights reserved. The codes documented in this report are preliminary and upon coder review may  be revised to meet current compliance requirements. Efrain Sella MD, MD 11/13/2022 8:47:41 AM This report has been signed electronically. Number of Addenda: 0 Note Initiated On: 11/13/2022 6:55 AM Scope Withdrawal Time: 0 hours 6 minutes 6 seconds  Total Procedure Duration: 0 hours 17 minutes 43 seconds  Estimated Blood Loss:  Estimated blood loss: none.      [facility]

## 2022-11-13 NOTE — Interval H&P Note (Signed)
History and Physical Interval Note:  11/13/2022 8:19 AM  Peggy Anderson  has presented today for surgery, with the diagnosis of Family history of polyps in the colon (Z83.719).  The various methods of treatment have been discussed with the patient and family. After consideration of risks, benefits and other options for treatment, the patient has consented to  Procedure(s) with comments: COLONOSCOPY (N/A) - PATIENT IN WHEELCHAIR - "HUSBAND CAN HELP" as a surgical intervention.  The patient's history has been reviewed, patient examined, no change in status, stable for surgery.  I have reviewed the patient's chart and labs.  Questions were answered to the patient's satisfaction.     [place], [place]

## 2022-11-13 NOTE — H&P (Signed)
Outpatient short stay form Pre-procedure 11/13/2022 8:18 AM Peggy Anderson K. [name], M.D.  Primary Physician: Emily Filbert, M.D.  Reason for visit:  Family history of colon polyps, colon cancer screening  History of present illness:   67 year old patient presenting for family history of colon polyps. Patient denies any change in bowel habits, rectal bleeding or involuntary weight loss.     Current Facility-Administered Medications:    0.9 %  sodium chloride infusion, , Intravenous, Continuous, Peggy Anderson, Peggy Pike, MD, Last Rate: 20 mL/hr at 11/13/22 0740, 20 mL/hr at 11/13/22 0740  No medications prior to admission.     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    Physical Exam  Gen: Alert, oriented. Appears stated age.  HEENT: [place]/AT. PERRLA. Lungs: CTA, no wheezes. CV: RR nl S1, S2. Abd: soft, benign, no masses. BS+ Ext: No edema. Pulses 2+    Planned procedures: Proceed with colonoscopy. The patient understands the nature of the planned procedure, indications, risks, alternatives and potential complications including but not limited to bleeding, infection, perforation, damage to internal organs and possible oversedation/side effects from anesthesia. The patient agrees and gives consent to proceed.  Please refer to procedure notes for findings, recommendations and patient disposition/instructions.     Peggy Anderson K. [name], M.D. Gastroenterology 11/13/2022  8:18 AM

## 2022-11-13 NOTE — Anesthesia Preprocedure Evaluation (Signed)
Anesthesia Evaluation  Patient identified by MRN, date of birth, ID band Patient awake    Reviewed: Allergy & Precautions, H&P , NPO status , Patient's Chart, lab work & pertinent test results, reviewed documented beta blocker date and time   History of Anesthesia Complications Negative for: history of anesthetic complications  Airway Mallampati: I  TM Distance: >3 FB Neck ROM: full    Dental  (+) Dental Advidsory Given, Caps, Teeth Intact   Pulmonary neg pulmonary ROS   Pulmonary exam normal breath sounds clear to auscultation       Cardiovascular Exercise Tolerance: Good negative cardio ROS Normal cardiovascular exam Rhythm:regular Rate:Normal     Neuro/Psych negative neurological ROS  negative psych ROS   GI/Hepatic negative GI ROS, Neg liver ROS,,,  Endo/Other  negative endocrine ROS    Renal/GU negative Renal ROS  negative genitourinary   Musculoskeletal   Abdominal   Peds  Hematology negative hematology ROS (+)   Anesthesia Other Findings Past Medical History: No date: Cancer ([place])     Comment:  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: Paraplegia ([place]) No date: Personal history of radiation therapy   Reproductive/Obstetrics negative OB ROS                             Anesthesia Physical Anesthesia Plan  ASA: 1  Anesthesia Plan: General   Post-op Pain Management:    Induction: Intravenous  PONV Risk Score and Plan: 3 and Propofol infusion, TIVA and Treatment may vary due to age or medical condition  Airway Management Planned:   Additional Equipment:   Intra-op Plan:   Post-operative Plan: Extubation in OR  Informed Consent: I have reviewed the patients History and Physical, chart, labs and discussed the procedure including the risks, benefits and alternatives for the proposed anesthesia with the patient or authorized representative who has indicated his/her  understanding and acceptance.     Dental Advisory Given  Plan Discussed with: Anesthesiologist, CRNA and Surgeon  Anesthesia Plan Comments:        Anesthesia Quick Evaluation

## 2022-11-13 NOTE — Transfer of Care (Signed)
Immediate Anesthesia Transfer of Care Note  Patient: Peggy Anderson  Procedure(s) Performed: COLONOSCOPY  Patient Location: PACU  Anesthesia Type:General  Level of Consciousness: drowsy  Airway & Oxygen Therapy: Patient Spontanous Breathing  Post-op Assessment: Report given to RN and Post -op Vital signs reviewed and stable  Post vital signs: Reviewed and stable  Last Vitals:  Vitals Value Taken Time  BP 114/59 11/13/22 0848  Temp 36.4 C 11/13/22 0847  Pulse 78 11/13/22 0848  Resp 18 11/13/22 0848  SpO2 100 % 11/13/22 0848  Vitals shown include unvalidated device data.  Last Pain:  Vitals:   11/13/22 0847  TempSrc: Temporal  PainSc: Asleep         Complications: No notable events documented.

## 2022-11-14 ENCOUNTER — Encounter: Payer: Self-pay | Admitting: Internal Medicine

## 2022-11-14 NOTE — Anesthesia Postprocedure Evaluation (Signed)
Anesthesia Post Note  Patient: Peggy Anderson  Procedure(s) Performed: COLONOSCOPY  Patient location during evaluation: Endoscopy Anesthesia Type: General Level of consciousness: awake and alert Pain management: pain level controlled Vital Signs Assessment: post-procedure vital signs reviewed and stable Respiratory status: spontaneous breathing, nonlabored ventilation, respiratory function stable and patient connected to nasal cannula oxygen Cardiovascular status: blood pressure returned to baseline and stable Postop Assessment: no apparent nausea or vomiting Anesthetic complications: no   No notable events documented.   Last Vitals:  Vitals:   11/13/22 0734 11/13/22 0847  BP: (!) 163/85 (!) 114/59  Pulse: 100 77  Resp: 20 16  Temp: (!) 36.3 C (!) 36.4 C  SpO2: 100% 100%    Last Pain:  Vitals:   11/14/22 0739  TempSrc:   PainSc: 0-No pain                 Martha Clan

## 2022-12-06 ENCOUNTER — Ambulatory Visit
Admission: RE | Admit: 2022-12-06 | Discharge: 2022-12-06 | Disposition: A | Discharge status: Home / Self Care | Payer: Medicare Other | Source: Ambulatory Visit | Attending: Internal Medicine | Admitting: Internal Medicine

## 2022-12-06 DIAGNOSIS — Z1231 Encounter for screening mammogram for malignant neoplasm of breast: Secondary | ICD-10-CM | POA: Insufficient documentation

## 2023-10-29 ENCOUNTER — Other Ambulatory Visit: Payer: Self-pay | Admitting: Internal Medicine

## 2023-10-29 DIAGNOSIS — Z1231 Encounter for screening mammogram for malignant neoplasm of breast: Secondary | ICD-10-CM

## 2023-12-08 ENCOUNTER — Ambulatory Visit
Admission: RE | Admit: 2023-12-08 | Discharge: 2023-12-08 | Disposition: A | Discharge status: Home / Self Care | Payer: Medicare Other | Source: Ambulatory Visit | Attending: Internal Medicine | Admitting: Internal Medicine

## 2023-12-08 DIAGNOSIS — Z1231 Encounter for screening mammogram for malignant neoplasm of breast: Secondary | ICD-10-CM | POA: Diagnosis present

## 2023-12-16 ENCOUNTER — Other Ambulatory Visit: Payer: Self-pay | Admitting: Internal Medicine

## 2023-12-16 DIAGNOSIS — R928 Other abnormal and inconclusive findings on diagnostic imaging of breast: Secondary | ICD-10-CM

## 2023-12-22 ENCOUNTER — Ambulatory Visit
Admission: RE | Admit: 2023-12-22 | Discharge: 2023-12-22 | Disposition: A | Discharge status: Home / Self Care | Source: Ambulatory Visit | Attending: Internal Medicine | Admitting: Internal Medicine

## 2023-12-22 DIAGNOSIS — R928 Other abnormal and inconclusive findings on diagnostic imaging of breast: Secondary | ICD-10-CM | POA: Diagnosis present
